# Patient Record
Sex: Male | Born: 1975 | Race: Black or African American | Hispanic: No | Marital: Single
Health system: Southern US, Community
[De-identification: ages and names within clinical notes are randomized; demographics above are authoritative.]

## PROBLEM LIST (undated history)

## (undated) DIAGNOSIS — I10 Essential (primary) hypertension: Secondary | ICD-10-CM

## (undated) DIAGNOSIS — H409 Unspecified glaucoma: Secondary | ICD-10-CM

---

## 2011-05-19 ENCOUNTER — Emergency Department: Payer: Self-pay | Admitting: Internal Medicine

## 2018-10-25 ENCOUNTER — Emergency Department
Admission: EM | Admit: 2018-10-25 | Discharge: 2018-10-25 | Disposition: A | Discharge status: Home / Self Care | Payer: No Typology Code available for payment source | Attending: Student in an Organized Health Care Education/Training Program | Admitting: Student in an Organized Health Care Education/Training Program

## 2018-10-25 ENCOUNTER — Encounter: Payer: Self-pay | Admitting: Emergency Medicine

## 2018-10-25 ENCOUNTER — Other Ambulatory Visit: Payer: Self-pay

## 2018-10-25 ENCOUNTER — Emergency Department: Payer: No Typology Code available for payment source

## 2018-10-25 DIAGNOSIS — M542 Cervicalgia: Secondary | ICD-10-CM | POA: Diagnosis not present

## 2018-10-25 DIAGNOSIS — R51 Headache: Secondary | ICD-10-CM | POA: Diagnosis present

## 2018-10-25 DIAGNOSIS — M7918 Myalgia, other site: Secondary | ICD-10-CM

## 2018-10-25 DIAGNOSIS — I10 Essential (primary) hypertension: Secondary | ICD-10-CM | POA: Insufficient documentation

## 2018-10-25 DIAGNOSIS — M791 Myalgia, unspecified site: Secondary | ICD-10-CM | POA: Insufficient documentation

## 2018-10-25 DIAGNOSIS — R0789 Other chest pain: Secondary | ICD-10-CM | POA: Diagnosis not present

## 2018-10-25 HISTORY — DX: Essential (primary) hypertension: I10

## 2018-10-25 HISTORY — DX: Unspecified glaucoma: H40.9

## 2018-10-25 MED ORDER — CYCLOBENZAPRINE HCL 10 MG PO TABS
10.0000 mg | ORAL_TABLET | Freq: Once | ORAL | Status: AC
  Administered 2018-10-25: 10 mg via ORAL
  Filled 2018-10-25: qty 1

## 2018-10-25 MED ORDER — TRAMADOL HCL 50 MG PO TABS
50.0000 mg | ORAL_TABLET | Freq: Once | ORAL | Status: AC
  Administered 2018-10-25: 50 mg via ORAL
  Filled 2018-10-25: qty 1

## 2018-10-25 MED ORDER — IBUPROFEN 600 MG PO TABS
600.0000 mg | ORAL_TABLET | Freq: Once | ORAL | Status: AC
  Administered 2018-10-25: 600 mg via ORAL
  Filled 2018-10-25: qty 1

## 2018-10-25 MED ORDER — IBUPROFEN 600 MG PO TABS: 600.0000 mg | ORAL_TABLET | Freq: Three times a day (TID) | ORAL | 0 refills | Status: AC | PRN

## 2018-10-25 MED ORDER — TRAMADOL HCL 50 MG PO TABS: 50.0000 mg | ORAL_TABLET | Freq: Two times a day (BID) | ORAL | 0 refills | Status: AC | PRN

## 2018-10-25 MED ORDER — CYCLOBENZAPRINE HCL 10 MG PO TABS: 10.0000 mg | ORAL_TABLET | Freq: Three times a day (TID) | ORAL | 0 refills | Status: AC | PRN
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
## 2020-10-08 ENCOUNTER — Ambulatory Visit: Payer: No Typology Code available for payment source | Attending: Internal Medicine

## 2020-10-08 ENCOUNTER — Other Ambulatory Visit: Payer: Self-pay

## 2020-10-08 DIAGNOSIS — Z23 Encounter for immunization: Secondary | ICD-10-CM

## 2020-10-08 NOTE — Progress Notes (Signed)
   Covid-19 Vaccination Clinic  Name:  Cuthbert Turton    MRN: [id number] DOB: Jul 03, 1976  10/08/2020  Mr. Rallis was observed post Covid-19 immunization for 15 minutes without incident. He was provided with Vaccine Information Sheet and instruction to access the V-Safe system.   Mr. Serano was instructed to call 911 with any severe reactions post vaccine: Marland Kitchen Difficulty breathing  . Swelling of face and throat  . A fast heartbeat  . A bad rash all over body  . Dizziness and weakness   Immunizations Administered    Name Date Dose VIS Date Route   Moderna Covid-19 Booster Vaccine 10/08/2020  2:50 PM 0.25 mL 07/18/2020 Intramuscular   Manufacturer: Gala Murdoch   Lot: [id number]   NDC: 10315-945-85

## 2023-05-21 ENCOUNTER — Ambulatory Visit: Payer: Medicare Other

## 2023-05-21 DIAGNOSIS — K640 First degree hemorrhoids: Secondary | ICD-10-CM | POA: Diagnosis not present

## 2023-05-21 DIAGNOSIS — D120 Benign neoplasm of cecum: Secondary | ICD-10-CM | POA: Diagnosis not present

## 2023-05-21 DIAGNOSIS — D128 Benign neoplasm of rectum: Secondary | ICD-10-CM | POA: Diagnosis not present

## 2023-05-21 DIAGNOSIS — Z1211 Encounter for screening for malignant neoplasm of colon: Secondary | ICD-10-CM | POA: Diagnosis not present

## 2024-06-15 ENCOUNTER — Ambulatory Visit (INDEPENDENT_AMBULATORY_CARE_PROVIDER_SITE_OTHER): Payer: Self-pay

## 2024-06-15 DIAGNOSIS — L820 Inflamed seborrheic keratosis: Secondary | ICD-10-CM | POA: Diagnosis not present

## 2024-06-15 DIAGNOSIS — L821 Other seborrheic keratosis: Secondary | ICD-10-CM

## 2024-06-15 DIAGNOSIS — D492 Neoplasm of unspecified behavior of bone, soft tissue, and skin: Secondary | ICD-10-CM | POA: Diagnosis not present

## 2024-06-15 DIAGNOSIS — L989 Disorder of the skin and subcutaneous tissue, unspecified: Secondary | ICD-10-CM

## 2024-06-15 DIAGNOSIS — L910 Hypertrophic scar: Secondary | ICD-10-CM | POA: Diagnosis not present

## 2024-06-15 DIAGNOSIS — D229 Melanocytic nevi, unspecified: Secondary | ICD-10-CM

## 2024-06-15 NOTE — Patient Instructions (Addendum)

## 2024-06-15 NOTE — Progress Notes (Signed)
 Subjective   James Molina is a 48 y.o. male who presents for the following: Lesion(s) of concern . Patient is new patient  Today patient reports: Lesion of concern at left jaw for a few months, patient reports did have keloids treated in the same place years ago so unsure if has come back. Also, has bump at upper lip x1.5 month, not painful, not getting bigger. Denies any known injury.   Review of Systems:    No other skin or systemic complaints except as noted in HPI or Assessment and Plan.  The following portions of the chart were reviewed this encounter and updated as appropriate: medications, allergies, medical history  Relevant Medical History:  n/a   Objective  Well appearing patient in no apparent distress; mood and affect are within normal limits. Examination was performed of the: Focused Exam of: Face, scalp, arms   Examination notable for:  Left mandible pedunculated skin colored skin papule  Scarring noted on face Upper lip R vermillion border with pink stuck on papule  Examination limited by: Undergarments, Clothing, and Patient deferred removal     Left mandible 4 mm pedunculated skin colored skin papule  Upper lip x1 Stuck on waxy paps with erythema  Assessment & Plan   BENIGN SKIN FINDINGS  - Seborrheic keratoses  - Nevus/Multiple Benign Nevi - Reassurance provided regarding the benign appearance of lesions noted on exam today; no treatment is indicated in the absence of symptoms/changes. - Reinforced importance of photoprotective strategies including liberal and frequent sunscreen use of a broad-spectrum SPF 30 or greater, use of protective clothing, and sun avoidance for prevention of cutaneous malignancy and photoaging.  Counseled patient on the importance of regular self-skin monitoring as well as routine clinical skin examinations as scheduled.   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   - shave removal of Lesion of L mandible   Level of service outlined above   Procedures, orders, diagnosis for this visit:  NEOPLASM OF SKIN Left mandible Epidermal / dermal shaving  Lesion diameter (cm):  0.4 Informed consent: discussed and consent obtained   Timeout: patient name, date of birth, surgical site, and procedure verified   Procedure prep:  Patient was prepped and draped in usual sterile fashion Prep type:  Isopropyl alcohol Anesthesia: the lesion was anesthetized in a standard fashion   Anesthetic:  1% lidocaine w/ epinephrine 1-100,000 buffered w/ 8.4% NaHCO3 Instrument used: DermaBlade   Hemostasis achieved with: pressure and aluminum chloride   Outcome: patient tolerated procedure well   Post-procedure details: wound care instructions given    Specimen 1 - Surgical pathology Differential Diagnosis: Keloid vs nevus vs sk r/o dysplasia   Check Margins: No 4 mm pedunculated skin colored skin papule INFLAMED SEBORRHEIC KERATOSIS Upper lip x1 Discussed with patient if not resolved after treatment of cryotherapy would recommend returning to care sooner and consider biopsy.    Symptomatic, irritating, patient would like treated. Destruction of lesion - Upper lip x1 Complexity: simple   Destruction method: cryotherapy   Informed consent: discussed and consent obtained   Timeout:  patient name, date of birth, surgical site, and procedure verified Lesion destroyed using liquid nitrogen: Yes   Region frozen until ice ball extended beyond lesion: Yes   Cryo cycles: 1 or 2. Outcome: patient tolerated procedure well with no complications   Post-procedure details: wound care instructions given  Additional details:  Prior to procedure, discussed risks of blister formation, small wound, skin dyspigmentation, or rare scar following cryotherapy. Recommend Vaseline ointment to treated areas while  healing.   MULTIPLE BENIGN NEVI   KELOID SCAR    Neoplasm of skin -     Epidermal / dermal shaving -     Surgical pathology; Standing  Inflamed seborrheic keratosis -     Destruction of lesion  Multiple benign nevi  Keloid scar    Return to clinic: Return if symptoms worsen or fail to improve.  I, Jacquelynn V. Wilfred, CMA, am acting as scribe for Lauraine JAYSON Kanaris, MD .  Documentation: I have reviewed the above documentation for accuracy and completeness, and I agree with the above.  Lauraine JAYSON Kanaris, MD

## 2024-06-16 ENCOUNTER — Ambulatory Visit: Payer: Self-pay

## 2024-06-16 LAB — SURGICAL PATHOLOGY

## 2024-06-16 NOTE — Telephone Encounter (Signed)
-----   Message from Lauraine JAYSON Kanaris sent at 06/16/2024  4:10 PM EDT -----  1. Skin, left mandible :       FIBROEPITHELIAL POLYP   Benign, observe.   ----- Message ----- From: Interface, Lab In Three Zero One Sent: 06/16/2024   3:45 PM EDT To: Lauraine JAYSON Kanaris, MD

## 2024-06-16 NOTE — Telephone Encounter (Signed)
Left message on voicemail to return my call.  

## 2024-06-21 NOTE — Telephone Encounter (Signed)
Left message on voicemail to return my call.  

## 2024-06-21 NOTE — Telephone Encounter (Signed)
-----   Message from Lauraine JAYSON Kanaris sent at 06/16/2024  4:10 PM EDT -----  1. Skin, left mandible :       FIBROEPITHELIAL POLYP   Benign, observe.   ----- Message ----- From: Interface, Lab In Three Zero One Sent: 06/16/2024   3:45 PM EDT To: Lauraine JAYSON Kanaris, MD

## 2024-06-22 NOTE — Telephone Encounter (Signed)
Left message on voicemail to return my call. Letter sent.  ?

## 2024-06-22 NOTE — Telephone Encounter (Signed)
-----   Message from Lauraine JAYSON Kanaris sent at 06/16/2024  4:10 PM EDT -----  1. Skin, left mandible :       FIBROEPITHELIAL POLYP   Benign, observe.   ----- Message ----- From: Interface, Lab In Three Zero One Sent: 06/16/2024   3:45 PM EDT To: Lauraine JAYSON Kanaris, MD
